# Patient Record
Sex: Female | Born: 1980 | Race: White | Hispanic: No | Smoking: Never smoker
Health system: Southern US, Community
[De-identification: ages and names within clinical notes are randomized; demographics above are authoritative.]

## PROBLEM LIST (undated history)

## (undated) DIAGNOSIS — F329 Major depressive disorder, single episode, unspecified: Secondary | ICD-10-CM

## (undated) DIAGNOSIS — F419 Anxiety disorder, unspecified: Secondary | ICD-10-CM

## (undated) DIAGNOSIS — F32A Depression, unspecified: Secondary | ICD-10-CM

## (undated) DIAGNOSIS — F319 Bipolar disorder, unspecified: Secondary | ICD-10-CM

## (undated) HISTORY — DX: Depression, unspecified: F32.A

## (undated) HISTORY — DX: Major depressive disorder, single episode, unspecified: F32.9

## (undated) HISTORY — DX: Bipolar disorder, unspecified: F31.9

## (undated) HISTORY — DX: Anxiety disorder, unspecified: F41.9

---

## 2002-10-18 HISTORY — PX: TUBAL LIGATION: SHX77

## 2013-07-19 ENCOUNTER — Encounter: Payer: Self-pay | Admitting: Neurology

## 2013-07-23 ENCOUNTER — Ambulatory Visit (INDEPENDENT_AMBULATORY_CARE_PROVIDER_SITE_OTHER): Payer: Medicaid Other | Admitting: Neurology

## 2013-07-23 ENCOUNTER — Encounter: Payer: Self-pay | Admitting: Neurology

## 2013-07-23 VITALS — BP 152/94 | HR 69 | Ht 66.0 in | Wt 277.5 lb

## 2013-07-23 DIAGNOSIS — M5416 Radiculopathy, lumbar region: Secondary | ICD-10-CM

## 2013-07-23 DIAGNOSIS — R202 Paresthesia of skin: Secondary | ICD-10-CM | POA: Insufficient documentation

## 2013-07-23 DIAGNOSIS — IMO0002 Reserved for concepts with insufficient information to code with codable children: Secondary | ICD-10-CM

## 2013-07-23 DIAGNOSIS — R209 Unspecified disturbances of skin sensation: Secondary | ICD-10-CM

## 2013-07-23 NOTE — Progress Notes (Signed)
[facility]  Provider:  Dr Hosie Poisson Referring Provider: Derrek Gu, MD Primary Care Physician:  No primary provider on file.  CC:  Back pain and paresthesias  HPI:  Nancy Snow is a 32 y.o. female here as a referral from Dr. Arlyce Harman for evaluation of back pain and paresthesias  Symptoms have been ongoing since around 2005. At that time she had a fall and feels she hurt her back. She did physical therapy we have and has been on Neurontin for years with no benefit. Pain is in her lower back and buttocks region radiating down to left leg. Described as a burning pain with some paresthesias and sensory loss in the left anterior medial thigh. Symptoms did not go down past the knee. She notes occasional extreme tenderness to palpation and light touch in the anterior and medial left thigh. Denies any weakness in the legs. No sensory loss, numbness or weakness in the right lower 70. She does note some numbness and paresthesias in her left upper stomach from the elbow down into the fifth digit, otherwise no complaints of bilateral upper extremity.  Went to [facility] neurology in the past, started on Neurontin, no benefit. Thinks they may have done a MRI but she is unsure. Unable to get the records.   Denies any visual changes.  Review of Systems: Out of a complete 14 system review, the patient complains of only the following symptoms, and all other reviewed systems are negative. Positive for fatigue anemia easy bruising memory loss headache numbness difficulty swallowing insomnia restless legs depression anxiety none of sleep decreased energy disinterest in activities racing thoughts and sensitivity aching muscles  History   Social History  . Marital Status: Divorced    Spouse Name: N/A    Number of Children: 2  . Years of Education: 8th   Occupational History  . Not on file.   Social History Main Topics  . Smoking status: Never Smoker   . Smokeless tobacco: Never Used  .  Alcohol Use: No  . Drug Use: No  . Sexual Activity: Not on file   Other Topics Concern  . Not on file   Social History Narrative   Patient lives at home with her kids and her mom.    Patient does not work.    Patient has a 8th grade education.    Patient has 2 children.     Family History  Problem Relation Age of Onset  . Bipolar disorder    . Headache    . Depression    . Panic disorder    . Cancer      Past Medical History  Diagnosis Date  . Bipolar 1 disorder   . Depression   . Anxiety     Past Surgical History  Procedure Laterality Date  . Tubal ligation  2004    Current Outpatient Prescriptions  Medication Sig Dispense Refill  . ARIPiprazole (ABILIFY) 15 MG tablet Take 15 mg by mouth daily.      . citalopram (CELEXA) 20 MG tablet Take 20 mg by mouth daily.      . clonazePAM (KLONOPIN) 1 MG tablet Take 1 mg by mouth daily.      Marland Kitchen gabapentin (NEURONTIN) 400 MG capsule Take 400 mg by mouth 2 (two) times daily.      Marland Kitchen zolpidem (AMBIEN) 10 MG tablet Take 10 mg by mouth daily.       No current facility-administered medications for this visit.    Allergies as  of 07/23/2013  . (No Known Allergies)    Vitals: BP 152/94  Pulse 69  Ht 5\' 6"  (1.676 m)  Wt 277 lb 8 oz (125.873 kg)  BMI 44.81 kg/m2 Last Weight:  Wt Readings from Last 1 Encounters:  07/23/13 277 lb 8 oz (125.873 kg)   Last Height:   Ht Readings from Last 1 Encounters:  07/23/13 5\' 6"  (1.676 m)     Physical exam: Exam: Gen: NAD, conversant Eyes: anicteric sclerae, moist conjunctivae HENT: Atraumatic, oropharynx clear Neck: Trachea midline; supple,  Lungs: CTA, no wheezing, rales, rhonic                          CV: RRR, no MRG Abdomen: Soft, non-tender;  Extremities: No peripheral edema  Skin: Normal temperature, no rash,  Psych: Appropriate affect, pleasant  Neuro: MS: AA&Ox3, appropriately interactive, normal affect   Speech: fluent w/o paraphasic error  Memory: good recent  and remote recall  CN: PERRL, EOMI no nystagmus, no ptosis, sensation intact to LT V1-V3 bilat, face symmetric, no weakness, hearing grossly intact, palate elevates symmetrically, shoulder shrug 5/5 bilat,  tongue protrudes midline, no fasiculations noted.  Motor: normal bulk and tone Strength: 5/5  In all extremities  Coord: rapid alternating and point-to-point (FNF, HTS) movements intact.  Reflexes: brisk but symmetrical, bilat downgoing toes  Sens: decreased LT in ulnar distribution from elbow distal to 5th digit on LUE, decreased LT and hyperesthesia in medial and anterior surface L thigh  Gait: posture, stance, stride and arm-swing normal. Tandem gait intact. Able to walk on heels and toes. Romberg absent.   Assessment:  After physical and neurologic examination, review of laboratory studies, imaging, neurophysiology testing and pre-existing records, assessment will be reviewed on the problem list.  Plan:  Treatment plan and additional workup will be reviewed under Problem List.  1)Paresthesias  [name] is a pleasant 32 year old worsening of her initial evaluation of chronic lower back pain and paresthesias of left upper extremity. Her physical exam is pertinent for decreased light touch in an ulnar distribution from elbow down the left upper extremity, paresthesias and left medial and anterior thigh and brisk reflexes throughout. Symptoms are most consistent with a neuropathy versus radiculopathy. Lower showed a symptoms concerning for meralgia paresthetica but not in typical distribution. Based on age and vague symptoms would also consider multiple sclerosis though this is less likely based on otherwise unremarkable exam. Will check EMG nerve conduction study, would consider MRI range and or C-spine in the future. Can consider lyrica or cymbalta in the future.

## 2013-07-23 NOTE — Patient Instructions (Addendum)
Overall you are doing fairly well but I do want to suggest a few things today:   As far as your medications are concerned, I would like to suggest  As far as diagnostic testing: I would like to get a EMG/NCS study. If this is unremarkable would consider MRI C spine  I would like to see you back once the workup is complete, sooner if we need to. Please call us with any interim questions, concerns, problems, updates or refill requests.   Please also call us for any test results so we can go over those with you on the phone.  My clinical assistant and will answer any of your questions and relay your messages to me and also relay most of my messages to you.   Our phone number is [phone]. We also have an after hours call service for urgent matters and there is a physician on-call for urgent questions. For any emergencies you know to call 911 or go to the nearest emergency room

## 2013-07-26 ENCOUNTER — Encounter (INDEPENDENT_AMBULATORY_CARE_PROVIDER_SITE_OTHER): Payer: Self-pay

## 2013-07-26 ENCOUNTER — Ambulatory Visit (INDEPENDENT_AMBULATORY_CARE_PROVIDER_SITE_OTHER): Payer: Medicaid Other | Admitting: Neurology

## 2013-07-26 ENCOUNTER — Telehealth: Payer: Self-pay | Admitting: Neurology

## 2013-07-26 DIAGNOSIS — M79609 Pain in unspecified limb: Secondary | ICD-10-CM

## 2013-07-26 DIAGNOSIS — Z0289 Encounter for other administrative examinations: Secondary | ICD-10-CM

## 2013-07-26 DIAGNOSIS — M5416 Radiculopathy, lumbar region: Secondary | ICD-10-CM

## 2013-07-26 DIAGNOSIS — R209 Unspecified disturbances of skin sensation: Secondary | ICD-10-CM

## 2013-07-26 MED ORDER — DULOXETINE HCL 30 MG PO CPEP: 30.0000 mg | ORAL_CAPSULE | Freq: Every day | ORAL | Status: DC

## 2013-07-26 NOTE — Telephone Encounter (Signed)
Called patient to discuss results of EMG/NCS. Will start patient on Cymbalta 30mg  daily.

## 2013-07-26 NOTE — Procedures (Signed)
  HISTORY:  Nancy Snow is a 32 year old morbidly obese patient with a history of bipolar disorder and a six-month history of numbness involving the left arm and left leg associated with some left lower back pain and some numbness into the left shoulder and upper spine. The patient is being evaluated for these complaints.  NERVE CONDUCTION STUDIES:  Nerve conduction studies were performed on the left upper extremity. The distal motor latencies and motor amplitudes for the median and ulnar nerves were within normal limits. The F wave latencies and nerve conduction velocities for these nerves were also normal. The sensory latencies for the median and ulnar nerves were normal.  Nerve conduction studies were performed on both lower extremities. The distal motor latencies and motor amplitudes for the peroneal and posterior tibial nerves were within normal limits. The nerve conduction velocities for these nerves were also normal. The H reflex latencies were normal. The sensory latencies for the peroneal nerves were within normal limits.   EMG STUDIES:  EMG study was performed on the left upper extremity:  The first dorsal interosseous muscle reveals 2 to 4 K units with full recruitment. No fibrillations or positive waves were noted. The abductor pollicis brevis muscle reveals 2 to 4 K units with full recruitment. No fibrillations or positive waves were noted. The extensor indicis proprius muscle reveals 1 to 3 K units with full recruitment. No fibrillations or positive waves were noted. The pronator teres muscle reveals 2 to 3 K units with full recruitment. No fibrillations or positive waves were noted. The biceps muscle reveals 1 to 2 K units with full recruitment. No fibrillations or positive waves were noted. The triceps muscle reveals 2 to 4 K units with full recruitment. No fibrillations or positive waves were noted. The anterior deltoid muscle reveals 2 to 3 K units with full recruitment. No  fibrillations or positive waves were noted. The cervical paraspinal muscles were tested at 2 levels. No abnormalities of insertional activity were seen at either level tested. There was good relaxation.  EMG study was performed on the left lower extremity:  The tibialis anterior muscle reveals 2 to 4K motor units with full recruitment. No fibrillations or positive waves were seen. The peroneus tertius muscle reveals 2 to 4K motor units with full recruitment. No fibrillations or positive waves were seen. The medial gastrocnemius muscle reveals 1 to 3K motor units with full recruitment. No fibrillations or positive waves were seen. The vastus lateralis muscle reveals 2 to 4K motor units with full recruitment. No fibrillations or positive waves were seen. The iliopsoas muscle reveals 2 to 4K motor units with full recruitment. No fibrillations or positive waves were seen. The biceps femoris muscle (long head) reveals 2 to 4K motor units with full recruitment. No fibrillations or positive waves were seen. The lumbosacral paraspinal muscles were tested at 3 levels, and revealed no abnormalities of insertional activity at all 3 levels tested. There was good relaxation.   IMPRESSION:  Nerve conduction studies done on the left upper extremity and both lower extremities were unremarkable. No evidence of a peripheral neuropathy is seen. EMG evaluation of the left upper and left lower extremities were unremarkable, without evidence of an overlying left cervical or a left lumbosacral radiculopathy.  Marlan Palau MD 07/26/2013 10:57 AM  [facility] [address] [place], [place] [address]  Phone [phone] Fax [phone]

## 2013-10-01 ENCOUNTER — Telehealth: Payer: Self-pay | Admitting: Neurology

## 2013-10-01 MED ORDER — DULOXETINE HCL 30 MG PO CPEP: 30.0000 mg | ORAL_CAPSULE | Freq: Every day | ORAL | Status: DC

## 2013-10-01 NOTE — Telephone Encounter (Signed)
Needs cymbalta called into [facility]

## 2014-01-11 ENCOUNTER — Other Ambulatory Visit: Payer: Self-pay

## 2014-01-11 MED ORDER — DULOXETINE HCL 30 MG PO CPEP: 30.0000 mg | ORAL_CAPSULE | Freq: Every day | ORAL | Status: DC

## 2014-01-21 ENCOUNTER — Other Ambulatory Visit: Payer: Self-pay | Admitting: Neurology

## 2014-01-21 ENCOUNTER — Telehealth: Payer: Self-pay | Admitting: *Deleted

## 2014-01-21 MED ORDER — PREGABALIN 75 MG PO CAPS: 75.0000 mg | ORAL_CAPSULE | Freq: Two times a day (BID) | ORAL | Status: DC

## 2014-01-21 NOTE — Telephone Encounter (Signed)
Pt called to see if anything was called in and what they were going to do that she was in a lot of pain. Pt [facility]. Please call pt concerning the note from Dr. Hosie PoissonSumner. Thanks

## 2014-01-21 NOTE — Telephone Encounter (Signed)
Please let her know I will send a prescription for Lyrica 75mg  twice a day. She should slowly get off the Cymbalta by decreasing to 60mg  daily for 2 weeks, then down to 30mg  daily for 2 weeks and then stop. Thanks.

## 2014-01-21 NOTE — Telephone Encounter (Signed)
Pt calling stating that the Cymbalta does not work and she has tripled the amount and it is still not working, is there anything else pt could try. Please advise

## 2014-01-21 NOTE — Telephone Encounter (Signed)
I called the patient back.  Relayed the message from Dr Minus BreedingSumner's note.  Patient verbalized understanding.

## 2014-01-23 ENCOUNTER — Ambulatory Visit: Payer: Self-pay | Admitting: Neurology

## 2014-01-24 ENCOUNTER — Encounter (INDEPENDENT_AMBULATORY_CARE_PROVIDER_SITE_OTHER): Payer: Self-pay

## 2014-01-24 ENCOUNTER — Encounter: Payer: Self-pay | Admitting: Neurology

## 2014-01-24 ENCOUNTER — Ambulatory Visit (INDEPENDENT_AMBULATORY_CARE_PROVIDER_SITE_OTHER): Payer: Medicaid Other | Admitting: Neurology

## 2014-01-24 VITALS — BP 158/99 | HR 67 | Ht 64.0 in | Wt 286.0 lb

## 2014-01-24 DIAGNOSIS — M545 Low back pain, unspecified: Secondary | ICD-10-CM

## 2014-01-24 MED ORDER — OXCARBAZEPINE 300 MG PO TABS: 300.0000 mg | ORAL_TABLET | Freq: Two times a day (BID) | ORAL | Status: DC

## 2014-01-24 NOTE — Progress Notes (Signed)
[facility]  Provider:  Dr Hosie Poisson Referring Provider: No ref. provider found Primary Care Physician:  No primary provider on file.  CC:  Back pain and paresthesias  HPI:  Nancy Snow is a 33 y.o. female here as a follow up for evaluation of back pain and paresthesias. Last visit was 07/2013 at which time she was started on Cymbalta and had a normal EMG/NCS. Since then stopped Cymbalta and switched to Lyrica. States since she started the Lyrica she continues to have pain and is having hallucinations on the Lyrica. States the pain is in the lower back and radiates down the posterior portion of her left leg. Notes episodes of muscle spasms in both left and right lumbar region. Notes all the symptoms have started since the fall she had in 2005. Notes some difficulty walking due to the pain in her lower back.    Initial visit 07/2013: Symptoms have been ongoing since around 2005. At that time she had a fall and feels she hurt her back. She did physical therapy we have and has been on Neurontin for years with no benefit. Pain is in her lower back and buttocks region radiating down to left leg. Described as a burning pain with some paresthesias and sensory loss in the left anterior medial thigh. Symptoms did not go down past the knee. She notes occasional extreme tenderness to palpation and light touch in the anterior and medial left thigh. Denies any weakness in the legs. No sensory loss, numbness or weakness in the right lower 70. She does note some numbness and paresthesias in her left upper stomach from the elbow down into the fifth digit, otherwise no complaints of bilateral upper extremity.  Went to [facility] neurology in the past, started on Neurontin, no benefit. Thinks they may have done a MRI but she is unsure. Unable to get the records.   Denies any visual changes.  Review of Systems: Out of a complete 14 system review, the patient complains of only the following symptoms,  and all other reviewed systems are negative. Positive for fatigue anemia easy bruising memory loss headache numbness difficulty swallowing insomnia restless legs depression anxiety none of sleep decreased energy disinterest in activities racing thoughts and sensitivity aching muscles  History   Social History  . Marital Status: Divorced    Spouse Name: N/A    Number of Children: 2  . Years of Education: 8th   Occupational History  . Not on file.   Social History Main Topics  . Smoking status: Never Smoker   . Smokeless tobacco: Never Used  . Alcohol Use: No  . Drug Use: No  . Sexual Activity: Not on file   Other Topics Concern  . Not on file   Social History Narrative   Patient lives at home with her kids and her mom.    Patient does not work.    Patient has a 8th grade education.    Patient has 2 children.     Family History  Problem Relation Age of Onset  . Bipolar disorder    . Headache    . Depression    . Panic disorder    . Cancer      Past Medical History  Diagnosis Date  . Bipolar 1 disorder   . Depression   . Anxiety     Past Surgical History  Procedure Laterality Date  . Tubal ligation  2004    Current Outpatient Prescriptions  Medication Sig Dispense Refill  .  ARIPiprazole (ABILIFY) 15 MG tablet Take 15 mg by mouth daily.      . citalopram (CELEXA) 20 MG tablet Take 20 mg by mouth daily.      . clonazePAM (KLONOPIN) 1 MG tablet Take 1 mg by mouth daily.      Marland Kitchen. zolpidem (AMBIEN) 10 MG tablet Take 10 mg by mouth daily.      . Oxcarbazepine (TRILEPTAL) 300 MG tablet Take 1 tablet (300 mg total) by mouth 2 (two) times daily.  60 tablet  3   No current facility-administered medications for this visit.    Allergies as of 01/24/2014  . (No Known Allergies)    Vitals: BP 158/99  Pulse 67  Ht 5\' 4"  (1.626 m)  Wt 286 lb (129.729 kg)  BMI 49.07 kg/m2 Last Weight:  Wt Readings from Last 1 Encounters:  01/24/14 286 lb (129.729 kg)   Last  Height:   Ht Readings from Last 1 Encounters:  01/24/14 5\' 4"  (1.626 m)     Physical exam: Exam: Gen: NAD, conversant Eyes: anicteric sclerae, moist conjunctivae HENT: Atraumatic, oropharynx clear Neck: Trachea midline; supple,  Lungs: CTA, no wheezing, rales, rhonic                          CV: RRR, no MRG Abdomen: Soft, non-tender;  Extremities: No peripheral edema  Skin: Normal temperature, no rash,  Psych: Appropriate affect, pleasant  Neuro: Nancy: AA&Ox3, appropriately interactive, normal affect   Speech: fluent w/o paraphasic error  Memory: good recent and remote recall  CN: PERRL, EOMI no nystagmus, no ptosis, sensation intact to LT V1-V3 bilat, face symmetric, no weakness, hearing grossly intact, palate elevates symmetrically, shoulder shrug 5/5 bilat,  tongue protrudes midline, no fasiculations noted.  Motor: normal bulk and tone Strength: 5/5  In all extremities  Coord: rapid alternating and point-to-point (FNF, HTS) movements intact.  Reflexes: brisk but symmetrical, bilat downgoing toes  Sens: decreased LT in ulnar distribution from elbow distal to 5th digit on LUE, decreased LT and hyperesthesia in medial and anterior surface L thigh  Gait: posture, stance, stride and arm-swing normal. Tandem gait intact. Able to walk on heels and toes. Romberg absent.   Assessment:  After physical and neurologic examination, review of laboratory studies, imaging, neurophysiology testing and pre-existing records, assessment will be reviewed on the problem list.  Plan:  Treatment plan and additional workup will be reviewed under Problem List.  1)Paresthesias 2)Lumbar back pain  Nancy Snow is a pleasant [age] year old worsening of her follow up evaluation of chronic lower back pain and paresthesias of left upper extremity. Her physical exam is pertinent for decreased light touch in an ulnar distribution from elbow down the left upper extremity, paresthesias and left medial and  anterior thigh and brisk reflexes throughout. Symptoms are most consistent with a neuropathy versus radiculopathy. Since last visit has had normal EMG/NCS and failed treatment with Cymbalta and Lyrica. Will check MRI L spine and try patient on trileptal 300mg  BID for symptomatic relief.

## 2014-01-24 NOTE — Patient Instructions (Addendum)
Overall you are doing fairly well but I do want to suggest a few things today:   Remember to drink plenty of fluid, eat healthy meals and do not skip any meals. Try to eat protein with a every meal and eat a healthy snack such as fruit or nuts in between meals. Try to keep a regular sleep-wake schedule and try to exercise daily, particularly in the form of walking, 20-30 minutes a day, if you can.   As far as your medications are concerned, I would like to suggest trying Trileptal 300mg  twice a day.   As far as diagnostic testing:  1)I would like you to have a MRI of your lumbar spine  Follow up as needed. Please call us with any interim questions, concerns, problems, updates or refill requests.   My clinical assistant and will answer any of your questions and relay your messages to me and also relay most of my messages to you.   Our phone number is [phone]. We also have an after hours call service for urgent matters and there is a physician on-call for urgent questions. For any emergencies you know to call 911 or go to the nearest emergency room

## 2014-02-04 ENCOUNTER — Telehealth: Payer: Self-pay | Admitting: Neurology

## 2014-02-04 MED ORDER — TIZANIDINE HCL 2 MG PO TABS: 2.0000 mg | ORAL_TABLET | Freq: Three times a day (TID) | ORAL | Status: DC

## 2014-02-04 NOTE — Telephone Encounter (Signed)
Pt is calling stating that she is taking Trileptal and the medication is not helping with the pain and would like to know if there is something else that she could take. Dr. Hosie PoissonSumner is out of the office, [facility] Dr. Anne HahnWillis. Please advise

## 2014-02-04 NOTE — Telephone Encounter (Signed)
I called patient. The patient has ongoing burning pains in the back with numbness in the back as well, pain into the buttock area. The patient is on gabapentin, she could not tolerate Lyrica, and the Trileptal is operating no real benefit. The patient will be placed on tizanidine for muscle spasm. The patient indicates that in the past she has undergone physical therapy. The patient may benefit from physical therapy again in the future.

## 2014-02-05 ENCOUNTER — Telehealth: Payer: Self-pay | Admitting: *Deleted

## 2014-02-05 NOTE — Telephone Encounter (Signed)
Patient called inquiring if taking Tizanidine is safe to take along with citalopram (CELEXA) 20 MG tablet.  She read the medication brochure for Celexa and it stated Tizanidine wasn't recommended to take at the same time.  Please call patient at earliest convenience.  thanks

## 2014-02-08 ENCOUNTER — Other Ambulatory Visit: Payer: Self-pay | Admitting: Neurology

## 2014-02-08 MED ORDER — BACLOFEN 10 MG PO TABS: 5.0000 mg | ORAL_TABLET | Freq: Two times a day (BID) | ORAL | Status: DC

## 2014-02-08 NOTE — Telephone Encounter (Signed)
Patient calling to state that she needs a different medication because the new one that Dr. Hosie PoissonSumner placed her on caused a reaction with her other medication. Patient describes dizziness, insomnia, and shortness of breath. Please call and advise patient.

## 2014-02-08 NOTE — Telephone Encounter (Signed)
Returned patients call. Will stop tizanidine and start baclofen 5mg  bid for muscle spasms. Can consider PT referral if no improvement as this has given good benefit in the past

## 2014-02-20 ENCOUNTER — Other Ambulatory Visit: Payer: Self-pay | Admitting: Neurology

## 2014-02-20 NOTE — Telephone Encounter (Signed)
Per 04/24 notes

## 2014-02-26 ENCOUNTER — Telehealth: Payer: Self-pay | Admitting: Neurology

## 2014-02-26 ENCOUNTER — Other Ambulatory Visit: Payer: Self-pay | Admitting: Neurology

## 2014-02-26 DIAGNOSIS — R531 Weakness: Secondary | ICD-10-CM

## 2014-02-26 DIAGNOSIS — M545 Low back pain, unspecified: Secondary | ICD-10-CM

## 2014-02-26 DIAGNOSIS — R202 Paresthesia of skin: Secondary | ICD-10-CM

## 2014-02-26 MED ORDER — METHYLPREDNISOLONE (PAK) 4 MG PO TABS: ORAL_TABLET | ORAL | Status: DC

## 2014-02-26 NOTE — Telephone Encounter (Signed)
Called patient to get her primary care provider's name, was not in the system, I am working on the referral that was placed by Dr Hosie PoissonSumner.

## 2014-02-26 NOTE — Telephone Encounter (Signed)
Patient calling to inform Dr Hosie PoissonSumner, her back pain has gotten worse and experiencing numbness in left leg.  Oxcarbazepine (TRILEPTAL) 300 MG tablet is not helping her discomfort.  Please call and advise.  Thanks

## 2014-02-26 NOTE — Telephone Encounter (Signed)
Returned patients call. With history of majority of symptoms being on the left side will check MRI brain to rule out MS. Will prescribe medrol dose pack for symptomatic relief.

## 2014-02-26 NOTE — Telephone Encounter (Signed)
Please let her know I would like her to follow up with physical therapy to help treat her symptoms. She will be called to schedule an appointment with them.

## 2014-02-26 NOTE — Telephone Encounter (Signed)
Shared message with patient per Dr Hosie PoissonSumner, and patient verbalized understanding and said that she was treated by PT before, did not help but is willing to try again. Would like Dr Hosie PoissonSumner to give her a call back , does not know what to do in the meantime, is not having the muscle spasms any longer but unable to get out of bed, has been there since Saturday,can't bend or barely use the lt leg.

## 2014-03-14 ENCOUNTER — Ambulatory Visit
Admission: RE | Admit: 2014-03-14 | Discharge: 2014-03-14 | Disposition: A | Discharge status: Home / Self Care | Payer: Medicaid Other | Source: Ambulatory Visit | Attending: Neurology | Admitting: Neurology

## 2014-03-14 DIAGNOSIS — R202 Paresthesia of skin: Secondary | ICD-10-CM

## 2014-03-14 DIAGNOSIS — R209 Unspecified disturbances of skin sensation: Secondary | ICD-10-CM

## 2014-03-14 DIAGNOSIS — R531 Weakness: Secondary | ICD-10-CM

## 2014-03-14 MED ORDER — GADOBENATE DIMEGLUMINE 529 MG/ML IV SOLN
20.0000 mL | Freq: Once | INTRAVENOUS | Status: AC | PRN
  Administered 2014-03-14: 20 mL via INTRAVENOUS

## 2014-03-18 NOTE — Progress Notes (Signed)
Quick Note:  Called patient and scheduled her for 04/11/14 at 11:30 am. ______

## 2014-03-20 ENCOUNTER — Telehealth: Payer: Self-pay | Admitting: *Deleted

## 2014-03-20 NOTE — Telephone Encounter (Signed)
Called patient to see if she wanted to come in earlier than her appointment on 04/11/14, appointment was r/s to 03/21/14 at 2:30

## 2014-03-21 ENCOUNTER — Encounter: Payer: Self-pay | Admitting: Neurology

## 2014-03-21 ENCOUNTER — Ambulatory Visit (INDEPENDENT_AMBULATORY_CARE_PROVIDER_SITE_OTHER): Payer: Medicaid Other | Admitting: Neurology

## 2014-03-21 VITALS — BP 143/96 | HR 74 | Ht 64.0 in | Wt 286.0 lb

## 2014-03-21 DIAGNOSIS — I6350 Cerebral infarction due to unspecified occlusion or stenosis of unspecified cerebral artery: Secondary | ICD-10-CM

## 2014-03-21 DIAGNOSIS — M545 Low back pain, unspecified: Secondary | ICD-10-CM

## 2014-03-21 DIAGNOSIS — I639 Cerebral infarction, unspecified: Secondary | ICD-10-CM

## 2014-03-21 NOTE — Progress Notes (Signed)
[facility]  Provider:  Dr Hosie Poisson Referring Provider: No ref. provider found Primary Care Physician:  No primary provider on file.  CC:  Back pain and paresthesias  HPI:  Nancy Snow is a 33 y.o. female here as a follow up for evaluation of back pain and paresthesias. Last visit was 01/2014 at which time she was started on Trileptal 300mg  BID after failing Lyrica and Cymbalta. Has worsening muscle spasms, baclofen was added. 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  Continues to have lower back pain with no benefit from recently added baclofen. No acute concerns at this time.    Initial visit 07/2013: Symptoms have been ongoing since around 2005. At that time she had a fall and feels she hurt her back. She did physical therapy we have and has been on Neurontin for years with no benefit. Pain is in her lower back and buttocks region radiating down to left leg. Described as a burning pain with some paresthesias and sensory loss in the left anterior medial thigh. Symptoms did not go down past the knee. She notes occasional extreme tenderness to palpation and light touch in the anterior and medial left thigh. Denies any weakness in the legs. No sensory loss, numbness or weakness in the right lower 70. She does note some numbness and paresthesias in her left upper stomach from the elbow down into the fifth digit, otherwise no complaints of bilateral upper extremity.  Went to [facility] neurology in the past, started on Neurontin, no benefit. Thinks they may have done a MRI but she is unsure. Unable to get the records.   Denies any visual changes.  Review of Systems: Out of a complete 14 system review, the patient complains of only the following symptoms, and all other reviewed systems are negative. Positive for fatigue anemia numbness difficulty swallowing insomnia restless legs depression disinterest in activities racing thoughts and sensitivity  aching muscles  History   Social History  . Marital Status: Divorced    Spouse Name: N/A    Number of Children: 2  . Years of Education: 8th   Occupational History  . Not on file.   Social History Main Topics  . Smoking status: Never Smoker   . Smokeless tobacco: Never Used  . Alcohol Use: No  . Drug Use: No  . Sexual Activity: Not on file   Other Topics Concern  . Not on file   Social History Narrative   Patient lives at home with her kids and her mom.    Patient does not work.    Patient has a 8th grade education.    Patient has 2 children.     Family History  Problem Relation Age of Onset  . Bipolar disorder    . Headache    . Depression    . Panic disorder    . Cancer      Past Medical History  Diagnosis Date  . Bipolar 1 disorder   . Depression   . Anxiety     Past Surgical History  Procedure Laterality Date  . Tubal ligation  2004    Current Outpatient Prescriptions  Medication Sig Dispense Refill  . ARIPiprazole (ABILIFY) 15 MG tablet Take 15 mg by mouth daily.      . baclofen (LIORESAL) 10 MG tablet TAKE 1/2 TABLET BY MOUTH TWICE A DAY  30 tablet  0  . citalopram (CELEXA) 20 MG tablet Take 20 mg by mouth daily.      Marland Kitchen  clonazePAM (KLONOPIN) 1 MG tablet Take 1 mg by mouth daily.      . methylPREDNIsolone (MEDROL DOSPACK) 4 MG tablet follow package directions  21 tablet  0  . Oxcarbazepine (TRILEPTAL) 300 MG tablet Take 1 tablet (300 mg total) by mouth 2 (two) times daily.  60 tablet  3  . zolpidem (AMBIEN) 10 MG tablet Take 10 mg by mouth daily.       No current facility-administered medications for this visit.    Allergies as of 03/21/2014  . (No Known Allergies)    Vitals: BP 143/96  Pulse 74  Ht 5\' 4"  (1.626 m)  Wt 286 lb (129.729 kg)  BMI 49.07 kg/m2 Last Weight:  Wt Readings from Last 1 Encounters:  03/21/14 286 lb (129.729 kg)   Last Height:   Ht Readings from Last 1 Encounters:  03/21/14 5\' 4"  (1.626 m)     Physical  exam: Exam: Gen: NAD, conversant Eyes: anicteric sclerae, moist conjunctivae HENT: Atraumatic, oropharynx clear Neck: Trachea midline; supple,  Lungs: CTA, no wheezing, rales, rhonic                          CV: RRR, no MRG Abdomen: Soft, non-tender;  Extremities: No peripheral edema  Skin: Normal temperature, no rash,  Psych: Appropriate affect, pleasant  Neuro: Nancy: AA&Ox3, appropriately interactive, normal affect   Speech: fluent w/o paraphasic error  Memory: good recent and remote recall  CN: PERRL, EOMI no nystagmus, no ptosis, sensation intact to LT V1-V3 bilat, face symmetric, no weakness, hearing grossly intact, palate elevates symmetrically, shoulder shrug 5/5 bilat,  tongue protrudes midline, no fasiculations noted.  Motor: normal bulk and tone Strength: 5/5  In all extremities  Coord: rapid alternating and point-to-point (FNF, HTS) movements intact.  Reflexes: brisk but symmetrical, bilat downgoing toes  Sens: decreased LT in ulnar distribution from elbow distal to 5th digit on LUE, decreased LT and hyperesthesia in medial and anterior surface L thigh  Gait: posture, stance, stride and arm-swing normal. Tandem gait intact. Able to walk on heels and toes. Romberg absent.   Assessment:  After physical and neurologic examination, review of laboratory studies, imaging, neurophysiology testing and pre-existing records, assessment will be reviewed on the problem list.  Plan:  Treatment plan and additional workup will be reviewed under Problem List.  1)Paresthesias 2)Lumbar back pain 3)Remote CVA  Nancy Snow is a pleasant 33 year old worsening of her follow up evaluation of chronic lower back pain and paresthesias of left upper extremity. Her main concern is continued chronic lumbar back pain which has been refractory to pain medication. Will try to get MRI of lumbar spine if insurance will approve. Will refer to pain clinic. MRI shows questionable small right thalamic  infarct which was asymptomatic. Will check Hemoglobin A1c and Lipid panel. Will have patient start ASA 81mg  daily. Follow up as needed.

## 2014-03-21 NOTE — Addendum Note (Signed)
Addended by: Ramond Marrow on: 03/21/2014 04:13 PM   Modules accepted: Orders

## 2014-03-21 NOTE — Patient Instructions (Signed)
Overall you are doing fairly well but I do want to suggest a few things today:   Remember to drink plenty of fluid, eat healthy meals and do not skip any meals. Try to eat protein with a every meal and eat a healthy snack such as fruit or nuts in between meals. Try to keep a regular sleep-wake schedule and try to exercise daily, particularly in the form of walking, 20-30 minutes a day, if you can.   As far as diagnostic testing:  1)Please have some blood work completed today after checking out 2)I would like you to have a MRI of the lumbar spine, you will be called to schedule this  I would like you to follow up with a pain doctor, you will be called to schedule this  Please also call us for any test results so we can go over those with you on the phone.  My clinical assistant and will answer any of your questions and relay your messages to me and also relay most of my messages to you.   Our phone number is [phone]. We also have an after hours call service for urgent matters and there is a physician on-call for urgent questions. For any emergencies you know to call 911 or go to the nearest emergency room

## 2014-03-22 ENCOUNTER — Other Ambulatory Visit: Payer: Self-pay | Admitting: Neurology

## 2014-03-22 LAB — LIPID PANEL WITH LDL/HDL RATIO
Cholesterol, Total: 211 mg/dL — ABNORMAL HIGH (ref 100–199)
HDL: 40 mg/dL (ref >39)
LDL Calculated: 148 mg/dL — ABNORMAL HIGH (ref 0–99)
LDL/HDL RATIO: 3.7 ratio — AB (ref 0.0–3.2)
TRIGLYCERIDES: 116 mg/dL (ref 0–149)
VLDL Cholesterol Cal: 23 mg/dL (ref 5–40)

## 2014-03-22 LAB — HGB A1C W/O EAG: Hgb A1c MFr Bld: 5.7 % — ABNORMAL HIGH (ref 4.8–5.6)

## 2014-03-22 NOTE — Progress Notes (Signed)
Quick Note:  Called patient and informed her of elevated blood sugars and cholesterol, and that she should follow up with her PCP to discuss better management, also informed patient that she may try to adjust her diet and if that doesn't work she then may need medication, patient expressed understanding and had no further questions or concerns. ______

## 2014-03-23 LAB — LUPUS ANTICOAGULANT
DRVVT: 25.1 s (ref 0.0–55.1)
PTT LA: 32.6 s (ref 0.0–50.0)
THROMBIN TIME: 16 s (ref 0.0–20.0)
dPT Confirm Ratio: 0.92 Ratio (ref 0.00–1.20)
dPT: 40.4 s (ref 0.0–55.0)

## 2014-04-02 ENCOUNTER — Telehealth: Payer: Self-pay | Admitting: Neurology

## 2014-04-02 NOTE — Telephone Encounter (Signed)
Patient calling to state that she has an MRI tomorrow night but is claustrophobic and has not received a call regarding a sedative that she can take before. Please return call to patient and advise.

## 2014-04-03 ENCOUNTER — Ambulatory Visit
Admission: RE | Admit: 2014-04-03 | Discharge: 2014-04-03 | Disposition: A | Discharge status: Home / Self Care | Payer: Medicaid Other | Source: Ambulatory Visit | Attending: Neurology | Admitting: Neurology

## 2014-04-03 DIAGNOSIS — M545 Low back pain, unspecified: Secondary | ICD-10-CM

## 2014-04-03 NOTE — Telephone Encounter (Signed)
Spoke to patient and she will pick up Xanax packet at the front desk.

## 2014-04-03 NOTE — Telephone Encounter (Signed)
That is fine. Thanks 

## 2014-04-03 NOTE — Telephone Encounter (Signed)
Patient requesting something to relax before MRI, we have xanax packets in pharmacy that we can give.  Just need your verbal order.

## 2014-04-04 ENCOUNTER — Other Ambulatory Visit: Payer: Self-pay | Admitting: Neurology

## 2014-04-05 NOTE — Telephone Encounter (Signed)
Nancy BlackwaterPeter Justin Sumner, DO at 02/08/2014 12:53 PM     Status: Signed        Returned patients call. Will stop tizanidine and start baclofen 5mg  bid for muscle spasms.

## 2014-04-08 ENCOUNTER — Other Ambulatory Visit: Payer: Self-pay | Admitting: Neurology

## 2014-04-08 NOTE — Telephone Encounter (Signed)
Per note on 04/21, Med was discontinued

## 2014-04-09 NOTE — Progress Notes (Signed)
Quick Note:  Spoke with patient and informed her of MRI lumbar spine showing some arthritic changes but overall normal, and to follow up with the pain doctor for additional treatment options. Patient expressed understanding no further questions or concerns. ______

## 2014-04-11 ENCOUNTER — Ambulatory Visit: Payer: Self-pay | Admitting: Neurology

## 2014-05-09 ENCOUNTER — Other Ambulatory Visit: Payer: Self-pay | Admitting: Neurology

## 2014-07-12 ENCOUNTER — Other Ambulatory Visit: Payer: Self-pay | Admitting: Neurology

## 2014-09-11 ENCOUNTER — Other Ambulatory Visit: Payer: Self-pay | Admitting: Neurology

## 2014-09-11 NOTE — Telephone Encounter (Signed)
WID 

## 2014-10-14 ENCOUNTER — Other Ambulatory Visit: Payer: Self-pay | Admitting: Neurology

## 2014-10-15 NOTE — Telephone Encounter (Signed)
I spoke with patient who will said she will call us back.

## 2014-11-14 ENCOUNTER — Other Ambulatory Visit: Payer: Self-pay | Admitting: Neurology

## 2016-06-24 ENCOUNTER — Encounter: Payer: Self-pay | Admitting: Neurology

## 2016-06-24 ENCOUNTER — Ambulatory Visit (INDEPENDENT_AMBULATORY_CARE_PROVIDER_SITE_OTHER): Payer: Medicaid Other | Admitting: Neurology

## 2016-06-24 VITALS — BP 160/104 | HR 66 | Ht 64.0 in | Wt 264.6 lb

## 2016-06-24 DIAGNOSIS — G43909 Migraine, unspecified, not intractable, without status migrainosus: Secondary | ICD-10-CM | POA: Insufficient documentation

## 2016-06-24 DIAGNOSIS — M545 Low back pain, unspecified: Secondary | ICD-10-CM

## 2016-06-24 DIAGNOSIS — G43001 Migraine without aura, not intractable, with status migrainosus: Secondary | ICD-10-CM

## 2016-06-24 DIAGNOSIS — I639 Cerebral infarction, unspecified: Secondary | ICD-10-CM

## 2016-06-24 DIAGNOSIS — M797 Fibromyalgia: Secondary | ICD-10-CM | POA: Diagnosis not present

## 2016-06-24 DIAGNOSIS — H4710 Unspecified papilledema: Secondary | ICD-10-CM

## 2016-06-24 DIAGNOSIS — R51 Headache with orthostatic component, not elsewhere classified: Secondary | ICD-10-CM

## 2016-06-24 DIAGNOSIS — H9190 Unspecified hearing loss, unspecified ear: Secondary | ICD-10-CM | POA: Diagnosis not present

## 2016-06-24 DIAGNOSIS — H539 Unspecified visual disturbance: Secondary | ICD-10-CM

## 2016-06-24 DIAGNOSIS — R7309 Other abnormal glucose: Secondary | ICD-10-CM | POA: Diagnosis not present

## 2016-06-24 DIAGNOSIS — E785 Hyperlipidemia, unspecified: Secondary | ICD-10-CM | POA: Diagnosis not present

## 2016-06-24 DIAGNOSIS — I6381 Other cerebral infarction due to occlusion or stenosis of small artery: Secondary | ICD-10-CM

## 2016-06-24 DIAGNOSIS — M542 Cervicalgia: Secondary | ICD-10-CM | POA: Diagnosis not present

## 2016-06-24 DIAGNOSIS — G8929 Other chronic pain: Secondary | ICD-10-CM

## 2016-06-24 DIAGNOSIS — G43009 Migraine without aura, not intractable, without status migrainosus: Secondary | ICD-10-CM

## 2016-06-24 DIAGNOSIS — H05119 Granuloma of unspecified orbit: Secondary | ICD-10-CM

## 2016-06-24 MED ORDER — AMITRIPTYLINE HCL 25 MG PO TABS: 25.0000 mg | ORAL_TABLET | Freq: Every day | ORAL | 12 refills | Status: AC

## 2016-06-24 NOTE — Patient Instructions (Addendum)
Remember to drink plenty of fluid, eat healthy meals and do not skip any meals. Try to eat protein with a every meal and eat a healthy snack such as fruit or nuts in between meals. Try to keep a regular sleep-wake schedule and try to exercise daily, particularly in the form of walking, 20-30 minutes a day, if you can.   As far as your medications are concerned, I would like to suggest; Amitriptyline, referral to pain clinic for evaluation of pain management and shots. Daily baby aspirin.  As far as diagnostic testing: MRI of the brain, MRI orbits  I would like to see you back in 4 months, sooner if we need to. Please call us with any interim questions, concerns, problems, updates or refill requests.   Our phone number is [phone]. We also have an after hours call service for urgent matters and there is a physician on-call for urgent questions. For any emergencies you know to call 911 or go to the nearest emergency room  Amitriptyline tablets What is this medicine? AMITRIPTYLINE (a mee TRIP ti leen) is used to treat depression. This medicine may be used for other purposes; ask your health care provider or pharmacist if you have questions. What should I tell my health care provider before I take this medicine? They need to know if you have any of these conditions: -an alcohol problem -asthma, difficulty breathing -bipolar disorder or schizophrenia -difficulty passing urine, prostate trouble -glaucoma -heart disease or previous heart attack -liver disease -over active thyroid -seizures -thoughts or plans of suicide, a previous suicide attempt, or family history of suicide attempt -an unusual or allergic reaction to amitriptyline, other medicines, foods, dyes, or preservatives -pregnant or trying to get pregnant -breast-feeding How should I use this medicine? Take this medicine by mouth with a drink of water. Follow the directions on the prescription label. You can take the tablets with  or without food. Take your medicine at regular intervals. Do not take it more often than directed. Do not stop taking this medicine suddenly except upon the advice of your doctor. Stopping this medicine too quickly may cause serious side effects or your condition may worsen. A special MedGuide will be given to you by the pharmacist with each prescription and refill. Be sure to read this information carefully each time. Talk to your pediatrician regarding the use of this medicine in children. Special care may be needed. Overdosage: If you think you have taken too much of this medicine contact a poison control center or emergency room at once. NOTE: This medicine is only for you. Do not share this medicine with others. What if I miss a dose? If you miss a dose, take it as soon as you can. If it is almost time for your next dose, take only that dose. Do not take double or extra doses. What may interact with this medicine? Do not take this medicine with any of the following medications: -arsenic trioxide -certain medicines used to regulate abnormal heartbeat or to treat other heart conditions -cisapride -droperidol -halofantrine -linezolid -MAOIs like Carbex, Eldepryl, Marplan, Nardil, and Parnate -methylene blue -other medicines for mental depression -phenothiazines like perphenazine, thioridazine and chlorpromazine -pimozide -probucol -procarbazine -sparfloxacin -St. John's Wort -ziprasidone This medicine may also interact with the following medications: -atropine and related drugs like hyoscyamine, scopolamine, tolterodine and others -barbiturate medicines for inducing sleep or treating seizures, like phenobarbital -cimetidine -disulfiram -ethchlorvynol -thyroid hormones such as levothyroxine This list may not describe all possible interactions. Give  your health care provider a list of all the medicines, herbs, non-prescription drugs, or dietary supplements you use. Also tell them if  you smoke, drink alcohol, or use illegal drugs. Some items may interact with your medicine. What should I watch for while using this medicine? Tell your doctor if your symptoms do not get better or if they get worse. Visit your doctor or health care professional for regular checks on your progress. Because it may take several weeks to see the full effects of this medicine, it is important to continue your treatment as prescribed by your doctor. Patients and their families should watch out for new or worsening thoughts of suicide or depression. Also watch out for sudden changes in feelings such as feeling anxious, agitated, panicky, irritable, hostile, aggressive, impulsive, severely restless, overly excited and hyperactive, or not being able to sleep. If this happens, especially at the beginning of treatment or after a change in dose, call your health care professional. Nancy Snow may get drowsy or dizzy. Do not drive, use machinery, or do anything that needs mental alertness until you know how this medicine affects you. Do not stand or sit up quickly, especially if you are an older patient. This reduces the risk of dizzy or fainting spells. Alcohol may interfere with the effect of this medicine. Avoid alcoholic drinks. Do not treat yourself for coughs, colds, or allergies without asking your doctor or health care professional for advice. Some ingredients can increase possible side effects. Your mouth may get dry. Chewing sugarless gum or sucking hard candy, and drinking plenty of water will help. Contact your doctor if the problem does not go away or is severe. This medicine may cause dry eyes and blurred vision. If you wear contact lenses you may feel some discomfort. Lubricating drops may help. See your eye doctor if the problem does not go away or is severe. This medicine can cause constipation. Try to have a bowel movement at least every 2 to 3 days. If you do not have a bowel movement for 3 days, call your  doctor or health care professional. This medicine can make you more sensitive to the sun. Keep out of the sun. If you cannot avoid being in the sun, wear protective clothing and use sunscreen. Do not use sun lamps or tanning beds/booths. What side effects may I notice from receiving this medicine? Side effects that you should report to your doctor or health care professional as soon as possible: -allergic reactions like skin rash, itching or hives, swelling of the face, lips, or tongue -abnormal production of milk in females -breast enlargement in both males and females -breathing problems -confusion, hallucinations -fast, irregular heartbeat -fever with increased sweating -muscle stiffness, or spasms -pain or difficulty passing urine, loss of bladder control -seizures -suicidal thoughts or other mood changes -swelling of the testicles -tingling, pain, or numbness in the feet or hands -yellowing of the eyes or skin Side effects that usually do not require medical attention (report to your doctor or health care professional if they continue or are bothersome): -change in sex drive or performance -constipation or diarrhea -nausea, vomiting -weight gain or loss This list may not describe all possible side effects. Call your doctor for medical advice about side effects. You may report side effects to FDA at 1-800-FDA-1088. Where should I keep my medicine? Keep out of the reach of children. Store at room temperature between 20 and 25 degrees C (68 and 77 degrees F). Throw away any unused medicine  after the expiration date. NOTE: This sheet is a summary. It may not cover all possible information. If you have questions about this medicine, talk to your doctor, pharmacist, or health care provider.    2016, [employer] Standard. (2012-02-21 13:50:32)

## 2016-06-24 NOTE — Progress Notes (Signed)
[facility]    Provider:  Dr Lucia Gaskins  CC:  Increased back pain  HPI:  Nancy Snow is a 35 y.o. female here as a follow up. She has Fibromyalgia and she hurts all over. Past medical history of depression, bipolar 1 disorder, anxiety and fibromyalgia, Chronic low back and neck pain. She has not slept in 2 months she has to rotate due to back pain and pain shooting from the back down the left leg. She has tightness in the low back, shooting pain down the left leg, and she has neck pain and muscle pain. Physical therapy didn't work, she went for a year. Tried Lyrica and had side effects. Neurontin doesn't work she has been on it for years. She has been up to high doses. She has tried everything in the book. Pain in the leg is new over the last 2 months only the left side with radiation into her buttocks and down the back of the left side of the leg. She can't sleep. Medications tried include: Celexa, Neurontin, Lyrica, Trileptal, Muscle relaxers, cymbalta.Her memory is worsening. Her speech is worsening and she is worried she had another TIA she is not on aspirin. She has worsening headaches. She has a lot of neck stiffness. She is having vision changes, left-sided headache, pressure, vision changes, getting blurry vision, pressure in the head, worse with laying down, she has hearing changes and feels shooting pains in the ear. FHx of seizures, strokes, and migraines. Headaches with nausea, vomiting, severe 10/10 and lasts for 2 days worsening. Chronic continuous low back pain. 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   MRI Lumbar spine:  Mildly abnormal MRI lumbar spine (without) demonstrating: 1. Multi-level facet hypertrophy.  2. Sacro-iliac arthropathy.  3. No significant disc bulging. No spinal stenosis or foraminal narrowing.  MRI brain:  Abnormal MRI scan of the brain showing a tiny right thalamic white matter hyperintensity likely remote age infarct versus gliosis or  injury. No acute abnormalities are noted.   HPI 03/21/2014 Elspeth Cho: LEI DOWER is a 35 y.o. female here as a follow up for evaluation of back pain and paresthesias. Last visit was 01/2014 at which time she was started on Trileptal 300mg  BID after failing Lyrica and Cymbalta. Has worsening muscle spasms, baclofen was added. 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  Continues to have lower back pain with no benefit from recently added baclofen. No acute concerns at this time.   Initial visit 07/2013 Dr. Hosie Poisson: Symptoms have been ongoing since around 2005. At that time she had a fall and feels she hurt her back. She did physical therapy we have and has been on Neurontin for years with no benefit. Pain is in her lower back and buttocks region radiating down to left leg. Described as a burning pain with some paresthesias and sensory loss in the left anterior medial thigh. Symptoms did not go down past the knee. She notes occasional extreme tenderness to palpation and light touch in the anterior and medial left thigh. Denies any weakness in the legs. No sensory loss, numbness or weakness in the right lower 70. She does note some numbness and paresthesias in her left upper stomach from the elbow down into the fifth digit, otherwise no complaints of bilateral upper extremity.  Went to [facility] neurology in the past, started on Neurontin, no benefit. Thinks they may have done a MRI but she is unsure. Unable to get the records.  Review of Systems: Patient complains of symptoms per HPI as well as the following symptoms: Chills, fatigue, cold intolerance, nausea, insomnia, daytime sleepiness, sleep talking, joint pain, joint swelling, back muscles, muscle cramps, walking difficulty, neck pain and stiffness, memory loss, dizziness, headache, numbness, weakness, agitation and decreased concentration, depression, nervousness and anxiety. Pertinent negatives per HPI. All  others negative.   Social History   Social History  . Marital status: Divorced    Spouse name: N/A  . Number of children: 2  . Years of education: 8th   Occupational History  . Not on file.   Social History Main Topics  . Smoking status: Never Smoker  . Smokeless tobacco: Never Used  . Alcohol use No  . Drug use: No  . Sexual activity: Not on file   Other Topics Concern  . Not on file   Social History Narrative   Patient lives at home with her kids and her mom.    Patient does not work.    Patient has a 8th grade education.    Patient has 2 children.     Family History  Problem Relation Age of Onset  . Bipolar disorder    . Headache    . Depression    . Panic disorder    . Cancer      Past Medical History:  Diagnosis Date  . Anxiety   . Bipolar 1 disorder (HCC)   . Depression     Past Surgical History:  Procedure Laterality Date  . TUBAL LIGATION  2004    Current Outpatient Prescriptions  Medication Sig Dispense Refill  . citalopram (CELEXA) 20 MG tablet Take 20 mg by mouth daily.    . clonazePAM (KLONOPIN) 1 MG tablet Take 1 mg by mouth daily.    Marland Kitchen. gabapentin (NEURONTIN) 300 MG capsule Take 300 mg by mouth 4 (four) times daily.    Marland Kitchen. lisdexamfetamine (VYVANSE) 50 MG capsule Take 50 mg by mouth daily.    Marland Kitchen. LORazepam (ATIVAN) 0.5 MG tablet Take 0.5 mg by mouth 2 (two) times daily.    . Oxcarbazepine (TRILEPTAL) 300 MG tablet TAKE 1 TABLET (300 MG TOTAL) BY MOUTH TWO TIMES DAILY (SCHEDULE APPT WITH NEW MD) 30 tablet 0  . zaleplon (SONATA) 10 MG capsule Take 10 mg by mouth at bedtime as needed for sleep.    Marland Kitchen. amitriptyline (ELAVIL) 25 MG tablet Take 1 tablet (25 mg total) by mouth at bedtime. 30 tablet 12   No current facility-administered medications for this visit.     Allergies as of 06/24/2016  . (No Known Allergies)    Vitals: BP (!) 160/104 (BP Location: Right Arm, Patient Position: Sitting, Cuff Size: Large)   Pulse 66   Ht 5\' 4"  (1.626 m)    Wt 264 lb 9.6 oz (120 kg)   BMI 45.42 kg/m  Last Weight:  Wt Readings from Last 1 Encounters:  06/24/16 264 lb 9.6 oz (120 kg)   Last Height:   Ht Readings from Last 1 Encounters:  06/24/16 5\' 4"  (1.626 m)    Physical exam: Exam: Gen: NAD, conversant, well nourised, obese, well groomed                     Eyes: Conjunctivae clear without exudates or hemorrhage  Neuro: Detailed Neurologic Exam  Speech:    Speech is normal; fluent and spontaneous with normal comprehension.  Cognition:    The patient is oriented to person, place, and time;  recent and remote memory intact;     language fluent;     normal attention, concentration,     fund of knowledge Cranial Nerves:    The pupils are equal, round, and reactive to light. Papilledema? The fundi are normal and spontaneous venous pulsations are present. Visual fields are full to finger confrontation. Extraocular movements are intact. Trigeminal sensation is intact and the muscles of mastication are normal. The face is symmetric. The palate elevates in the midline. Hearing intact. Voice is normal. Shoulder shrug is normal. The tongue has normal motion without fasciculations.   Coordination:    Normal finger to nose and heel to shin.   Motor Observation:    No asymmetry, no atrophy, and no involuntary movements noted. Tone:    Normal muscle tone.    Posture:    Posture is normal. normal erect    Strength:    Strength is V/V in the upper and lower limbs.      Assessment:  After physical and neurologic examination, review of laboratory studies, imaging, neurophysiology testing and pre-existing records, assessment will be reviewed on the problem list.  Plan:  Treatment plan and additional workup will be reviewed under Problem List.  1)Headaches 2)Lumbar back pain 3)Remote CVA  Ms Ercole is a pleasant 35 year old worsening of her follow up evaluation of headaches, chronic lower back pain, vision changes, chronic pain  and fibromyalgia, remote Her main concern is continued chronic lumbar back pain which has been refractory to pain medication.  Will refer to pain clinic. MRI shows questionable small right thalamic infarct which was asymptomatic. Will check Hemoglobin A1c and Lipid panel. Will have patient start ASA 81mg  daily.   - Daily baby aspirin - check cholesterol - check HgbA1c - Not on birth control pills, does not smoke. - MRi of the brain and orbits for worsening headaches, vision changes, possible papilledema - pain clinic for low back pain and evaluation for epidural steroid injections - start amitriptyline patient's migraines, headaches, chronic pain may also help with her insomnia. Discussed side effects as per patient instructions. Do not get pregnant on this medication.  Naomie Dean, MD  [facility] [address] [place], [place] [address]  Phone [phone] Fax [phone]  A total of 40 minutes was spent face-to-face with this patient. Over half this time was spent on counseling patient on the chronic low back pain, headache and papilledema, lacunar infarct, fibromyalgia diagnosis and different diagnostic and therapeutic options available.

## 2016-06-25 ENCOUNTER — Telehealth: Payer: Self-pay | Admitting: *Deleted

## 2016-06-25 LAB — HEMOGLOBIN A1C
ESTIMATED AVERAGE GLUCOSE: 100 mg/dL
HEMOGLOBIN A1C: 5.1 % (ref 4.8–5.6)

## 2016-06-25 LAB — COMPREHENSIVE METABOLIC PANEL
ALBUMIN: 4.2 g/dL (ref 3.5–5.5)
ALT: 13 IU/L (ref 0–32)
AST: 14 IU/L (ref 0–40)
Albumin/Globulin Ratio: 1.6 (ref 1.2–2.2)
Alkaline Phosphatase: 69 IU/L (ref 39–117)
BUN/Creatinine Ratio: 11 (ref 9–23)
BUN: 11 mg/dL (ref 6–20)
Bilirubin Total: 0.2 mg/dL (ref 0.0–1.2)
CALCIUM: 9.3 mg/dL (ref 8.7–10.2)
CO2: 25 mmol/L (ref 18–29)
Chloride: 101 mmol/L (ref 96–106)
Creatinine, Ser: 0.99 mg/dL (ref 0.57–1.00)
GFR calc Af Amer: 86 mL/min/{1.73_m2} (ref >59)
GFR calc non Af Amer: 75 mL/min/{1.73_m2} (ref >59)
GLOBULIN, TOTAL: 2.7 g/dL (ref 1.5–4.5)
Glucose: 106 mg/dL — ABNORMAL HIGH (ref 65–99)
POTASSIUM: 5.5 mmol/L — AB (ref 3.5–5.2)
Sodium: 139 mmol/L (ref 134–144)
TOTAL PROTEIN: 6.9 g/dL (ref 6.0–8.5)

## 2016-06-25 LAB — LIPID PANEL
CHOLESTEROL TOTAL: 159 mg/dL (ref 100–199)
Chol/HDL Ratio: 3.2 ratio units (ref 0.0–4.4)
HDL: 49 mg/dL (ref >39)
LDL Calculated: 93 mg/dL (ref 0–99)
Triglycerides: 86 mg/dL (ref 0–149)
VLDL Cholesterol Cal: 17 mg/dL (ref 5–40)

## 2016-06-25 LAB — CBC
Hematocrit: 37.6 % (ref 34.0–46.6)
Hemoglobin: 12.4 g/dL (ref 11.1–15.9)
MCH: 28.1 pg (ref 26.6–33.0)
MCHC: 33 g/dL (ref 31.5–35.7)
MCV: 85 fL (ref 79–97)
Platelets: 257 10*3/uL (ref 150–379)
RBC: 4.41 x10E6/uL (ref 3.77–5.28)
RDW: 14.4 % (ref 12.3–15.4)
WBC: 5 10*3/uL (ref 3.4–10.8)

## 2016-06-25 NOTE — Telephone Encounter (Signed)
-----   Message from Anson FretAntonia B Ahern, MD sent at 06/25/2016  8:54 AM EDT ----- Labs are unremarkable. Her bad cholesterol is 93 which isn't bad, technically normal. Pending the MRI of the brain results we can see if should start a statin. Will call her after she has MRi of the brain thanks.

## 2016-06-25 NOTE — Telephone Encounter (Signed)
Called and spoke to patient about lab results per Dr Lucia GaskinsAhern note. She verbalized understanding. She asked how long it usually takes to be contacted to schedule MRI. Advised typically should hear about scheduling within a week or week and a half. If she does not hear about scheduling within that time frame, she can call me.

## 2016-07-09 ENCOUNTER — Ambulatory Visit
Admission: RE | Admit: 2016-07-09 | Discharge: 2016-07-09 | Disposition: A | Discharge status: Home / Self Care | Payer: Medicaid Other | Source: Ambulatory Visit | Attending: Neurology | Admitting: Neurology

## 2016-07-09 DIAGNOSIS — H539 Unspecified visual disturbance: Secondary | ICD-10-CM | POA: Diagnosis not present

## 2016-07-09 DIAGNOSIS — R51 Headache with orthostatic component, not elsewhere classified: Secondary | ICD-10-CM

## 2016-07-09 DIAGNOSIS — H05119 Granuloma of unspecified orbit: Secondary | ICD-10-CM

## 2016-07-09 DIAGNOSIS — H4710 Unspecified papilledema: Secondary | ICD-10-CM | POA: Diagnosis not present

## 2016-07-09 DIAGNOSIS — H9190 Unspecified hearing loss, unspecified ear: Secondary | ICD-10-CM

## 2016-07-09 DIAGNOSIS — I6381 Other cerebral infarction due to occlusion or stenosis of small artery: Secondary | ICD-10-CM

## 2016-07-09 MED ORDER — GADOBENATE DIMEGLUMINE 529 MG/ML IV SOLN
20.0000 mL | Freq: Once | INTRAVENOUS | Status: AC | PRN
  Administered 2016-07-09: 20 mL via INTRAVENOUS

## 2016-07-12 ENCOUNTER — Telehealth: Payer: Self-pay | Admitting: *Deleted

## 2016-07-12 NOTE — Telephone Encounter (Signed)
Called and relayed results per Dr Lucia GaskinsAhern. Pt verbalized understanding. She states she will continue daily baby aspirin per Dr Lucia GaskinsAhern recommendation.

## 2016-07-12 NOTE — Telephone Encounter (Signed)
-----   Message from Anson FretAntonia B Ahern, MD sent at 07/11/2016  7:22 PM EDT ----- MRI of the brain is unremarkable. No changes since 2015. I advise her to continue to take daily baby aspirin but no definitive evidence for stroke in her brain thanks

## 2016-10-25 ENCOUNTER — Ambulatory Visit: Payer: Medicaid Other | Admitting: Neurology

## 2016-10-25 ENCOUNTER — Telehealth: Payer: Self-pay | Admitting: *Deleted

## 2016-10-25 NOTE — Telephone Encounter (Signed)
no showed f/u 

## 2016-10-26 ENCOUNTER — Encounter: Payer: Self-pay | Admitting: Neurology

## 2017-06-06 ENCOUNTER — Telehealth: Payer: Self-pay | Admitting: Neurology

## 2017-06-06 NOTE — Telephone Encounter (Signed)
Spoke to pt and was checking if she had seen PM ( Dr. Ethelene Hal) she stated she never got a call.  Will forward message to [facility] in referrals, and see if they can see her now.

## 2017-06-06 NOTE — Telephone Encounter (Signed)
Patient has been scheduled to see Butch Penny 06/14/17 @ 9:30am.  Follow up for left increasing left leg pain.  Dr. Lucia Gaskins patient. FYI.

## 2017-06-07 ENCOUNTER — Telehealth: Payer: Self-pay | Admitting: Neurology

## 2017-06-07 NOTE — Telephone Encounter (Signed)
[facility] [place] [facility] and relayed patient has CX two apt's and patient will need to call and schedule. Called patient and relayed to patient and she understood details.  Sent referral to Dr. Ethelene [phone] fax [phone] .

## 2017-06-14 ENCOUNTER — Encounter: Payer: Medicaid Other | Admitting: Adult Health

## 2017-06-14 ENCOUNTER — Telehealth: Payer: Self-pay | Admitting: Adult Health

## 2017-06-14 NOTE — Telephone Encounter (Signed)
[facility] Patient can't be scheduled until she calls . Patient has had two CX and One No-show.

## 2017-06-14 NOTE — Progress Notes (Signed)
This encounter was created in error - please disregard.

## 2017-06-14 NOTE — Telephone Encounter (Signed)
Pt was attempting to call when in the office.

## 2017-10-30 ENCOUNTER — Emergency Department (HOSPITAL_BASED_OUTPATIENT_CLINIC_OR_DEPARTMENT_OTHER): Payer: Medicaid Other

## 2017-10-30 ENCOUNTER — Other Ambulatory Visit: Payer: Self-pay

## 2017-10-30 ENCOUNTER — Emergency Department (HOSPITAL_BASED_OUTPATIENT_CLINIC_OR_DEPARTMENT_OTHER)
Admission: EM | Admit: 2017-10-30 | Discharge: 2017-10-30 | Disposition: A | Discharge status: Home / Self Care | Payer: Medicaid Other | Attending: Emergency Medicine | Admitting: Emergency Medicine

## 2017-10-30 DIAGNOSIS — Z79899 Other long term (current) drug therapy: Secondary | ICD-10-CM | POA: Diagnosis not present

## 2017-10-30 DIAGNOSIS — S93401A Sprain of unspecified ligament of right ankle, initial encounter: Secondary | ICD-10-CM | POA: Diagnosis not present

## 2017-10-30 DIAGNOSIS — Y9301 Activity, walking, marching and hiking: Secondary | ICD-10-CM | POA: Diagnosis not present

## 2017-10-30 DIAGNOSIS — Y998 Other external cause status: Secondary | ICD-10-CM | POA: Insufficient documentation

## 2017-10-30 DIAGNOSIS — X509XXA Other and unspecified overexertion or strenuous movements or postures, initial encounter: Secondary | ICD-10-CM | POA: Insufficient documentation

## 2017-10-30 DIAGNOSIS — S99911A Unspecified injury of right ankle, initial encounter: Secondary | ICD-10-CM | POA: Diagnosis present

## 2017-10-30 DIAGNOSIS — Y929 Unspecified place or not applicable: Secondary | ICD-10-CM | POA: Diagnosis not present

## 2017-10-30 NOTE — ED Triage Notes (Signed)
Patient states that she twisted her right ankle last week and she continues to have pain to her right ankle and swelling  - patient reports that she was here with her mom so she thought she would get checked out as well

## 2017-10-30 NOTE — ED Provider Notes (Signed)
[facility] EMERGENCY DEPARTMENT Provider Note   CSN: [id number] Arrival date & time: 10/30/17  1019     History   Chief Complaint Chief Complaint  Patient presents with  . Ankle Pain    HPI Nancy Snow is a 37 y.o. female.  Patient presents with continued ankle pain after a twisting injury 1 week ago.  In the interim, she slipped again and reaggravated it.  She has swelling and pain over the lateral aspect of her right ankle.  She states that she has been using ibuprofen without relief.  She has been dragging her foot when she walks. No knee or hip pain. The onset of this condition was acute. The course is constant. Aggravating factors: walking. Alleviating factors: none.        Past Medical History:  Diagnosis Date  . Anxiety   . Bipolar 1 disorder (HCC)   . Depression     Patient Active Problem List   Diagnosis Date Noted  . Chronic low back pain 06/24/2016  . Migraine 06/24/2016  . Paresthesias 07/23/2013      OB History    No data available       Home Medications    Prior to Admission medications   Medication Sig Start Date End Date Taking? Authorizing Provider  amitriptyline (ELAVIL) 25 MG tablet Take 1 tablet (25 mg total) by mouth at bedtime. Patient taking differently: Take 50 mg by mouth at bedtime.  06/24/16   Anson FretAhern, Antonia B, MD  buPROPion (WELLBUTRIN XL) 150 MG 24 hr tablet Take 150 mg by mouth daily.    [provider]  Eszopiclone (ESZOPICLONE) 3 MG TABS Take 3 mg by mouth at bedtime as needed. Take immediately before bedtime    [provider]  gabapentin (NEURONTIN) 300 MG capsule Take 600 mg by mouth 4 (four) times daily.     [provider]  lisdexamfetamine (VYVANSE) 50 MG capsule Take 50 mg by mouth daily.    [provider]  LORazepam (ATIVAN) 0.5 MG tablet Take 1 mg by mouth 3 (three) times daily.     [provider]  Oxcarbazepine (TRILEPTAL) 300 MG tablet TAKE 1 TABLET (300 MG  TOTAL) BY MOUTH TWO TIMES DAILY (SCHEDULE APPT WITH NEW MD) 11/15/14   York SpanielWillis, Charles K, MD  zolpidem (AMBIEN) 10 MG tablet Take 10 mg by mouth at bedtime as needed for sleep.    [provider]    Family History Family History  Problem Relation Age of Onset  . Bipolar disorder Unknown   . Headache Unknown   . Depression Unknown   . Panic disorder Unknown   . Cancer Unknown     Social History Social History   Tobacco Use  . Smoking status: Never Smoker  . Smokeless tobacco: Never Used  Substance Use Topics  . Alcohol use: No  . Drug use: No     Allergies   Patient has no known allergies.   Review of Systems Review of Systems  Constitutional: Negative for activity change.  Musculoskeletal: Positive for arthralgias and joint swelling. Negative for back pain and neck pain.  Skin: Negative for wound.  Neurological: Negative for weakness and numbness.     Physical Exam Updated Vital Signs BP (!) 170/63 (BP Location: Left Arm)   Pulse 86   Temp 98.5 F (36.9 C) (Oral)   Resp 18   Ht 5\' 4"  (1.626 m)   Wt 122.9 kg (271 lb)   SpO2 100%  BMI 46.52 kg/m   Physical Exam  Constitutional: She appears well-developed and well-nourished.  HENT:  Head: Normocephalic and atraumatic.  Eyes: Pupils are equal, round, and reactive to light.  Neck: Normal range of motion. Neck supple.  Cardiovascular: Normal pulses. Exam reveals no decreased pulses.  Pulses:      Dorsalis pedis pulses are 2+ on the right side, and 2+ on the left side.  Musculoskeletal: She exhibits tenderness. She exhibits no edema.       Right hip: Normal.       Right knee: Normal. No tenderness found. No medial joint line and no lateral joint line tenderness noted.       Right ankle: She exhibits decreased range of motion, swelling and ecchymosis. Tenderness. Lateral malleolus tenderness found.       Right foot: There is normal range of motion, no tenderness and no bony tenderness.  Neurological:  She is alert. No sensory deficit.  Motor, sensation, and vascular distal to the injury is fully intact.   Skin: Skin is warm and dry.  Psychiatric: She has a normal mood and affect.  Nursing note and vitals reviewed.    ED Treatments / Results  Labs (all labs ordered are listed, but only abnormal results are displayed) Labs Reviewed - No data to display  EKG  EKG Interpretation None       Radiology Dg Ankle Complete Right  Result Date: 10/30/2017 CLINICAL DATA:  Right ankle pain and swelling following a twisting injury last week. EXAM: RIGHT ANKLE - COMPLETE 3+ VIEW COMPARISON:  None. FINDINGS: Diffuse soft tissue swelling, most pronounced laterally and anteriorly. No fracture, dislocation or effusion seen. IMPRESSION: No fracture. Electronically Signed   By: Beckie Salts M.D.   On: 10/30/2017 11:09    Procedures Procedures (including critical care time)  Medications Ordered in ED Medications - No data to display   Initial Impression / Assessment and Plan / ED Course  I have reviewed the triage vital signs and the nursing notes.  Pertinent labs & imaging results that were available during my care of the patient were reviewed by me and considered in my medical decision making (see chart for details).     Patient seen and examined. X-ray neg.   Vital signs reviewed and are as follows: BP (!) 170/63 (BP Location: Left Arm)   Pulse 86   Temp 98.5 F (36.9 C) (Oral)   Resp 18   Ht 5\' 4"  (1.626 m)   Wt 122.9 kg (271 lb)   SpO2 100%   BMI 46.52 kg/m   Plan: ACE wrap (pt declines crutches), ortho f/u, RICE/NSAIDs. Discussed ROM exercises to increase mobility.   Final Clinical Impressions(s) / ED Diagnoses   Final diagnoses:  Sprain of right ankle, unspecified ligament, initial encounter   Patient with ankle sprain, negative imaging.  Lower extremity neurovascularly intact.  Plan as above.  ED Discharge Orders    None       Renne Crigler, Cordelia Poche 10/30/17  1125    Charlynne Pander, MD 10/30/17 1440

## 2017-10-30 NOTE — Discharge Instructions (Signed)
Please read and follow all provided instructions.  Your diagnoses today include:  1. Sprain of right ankle, unspecified ligament, initial encounter     Tests performed today include:  An x-ray of your ankle - does NOT show any broken bones  Vital signs. See below for your results today.   Medications prescribed:   None  Take any prescribed medications only as directed.  Home care instructions:   Follow any educational materials contained in this packet  Follow R.I.C.E. Protocol:  R - rest your injury   I  - use ice on injury without applying directly to skin  C - compress injury with bandage or splint  E - elevate the injury as much as possible  Follow-up instructions: Please follow-up with the provided orthopedic (bone specialist).   Return instructions:   Please return if your toes are numb or tingling, appear gray or blue, or you have severe pain (also elevate leg and loosen splint or wrap)  Please return to the Emergency Department if you experience worsening symptoms.   Please return if you have any other emergent concerns.  Additional Information:  Your vital signs today were: BP (!) 170/63 (BP Location: Left Arm)    Pulse 86    Temp 98.5 F (36.9 C) (Oral)    Resp 18    Ht 5\' 4"  (1.626 m)    Wt 122.9 kg (271 lb)    SpO2 100%    BMI 46.52 kg/m  If your blood pressure (BP) was elevated above 135/85 this visit, please have this repeated by your doctor within one month. --------------

## 2017-12-29 NOTE — Telephone Encounter (Signed)
Error

## 2018-09-10 IMAGING — DX DG ANKLE COMPLETE 3+V*R*
3 series · 3 of 3 positions shown · non-contrast
Comparison: None.

CLINICAL DATA: Right ankle pain and swelling following a twisting
injury last week.

EXAM:
RIGHT ANKLE - COMPLETE 3+ VIEW

[ankle ap]
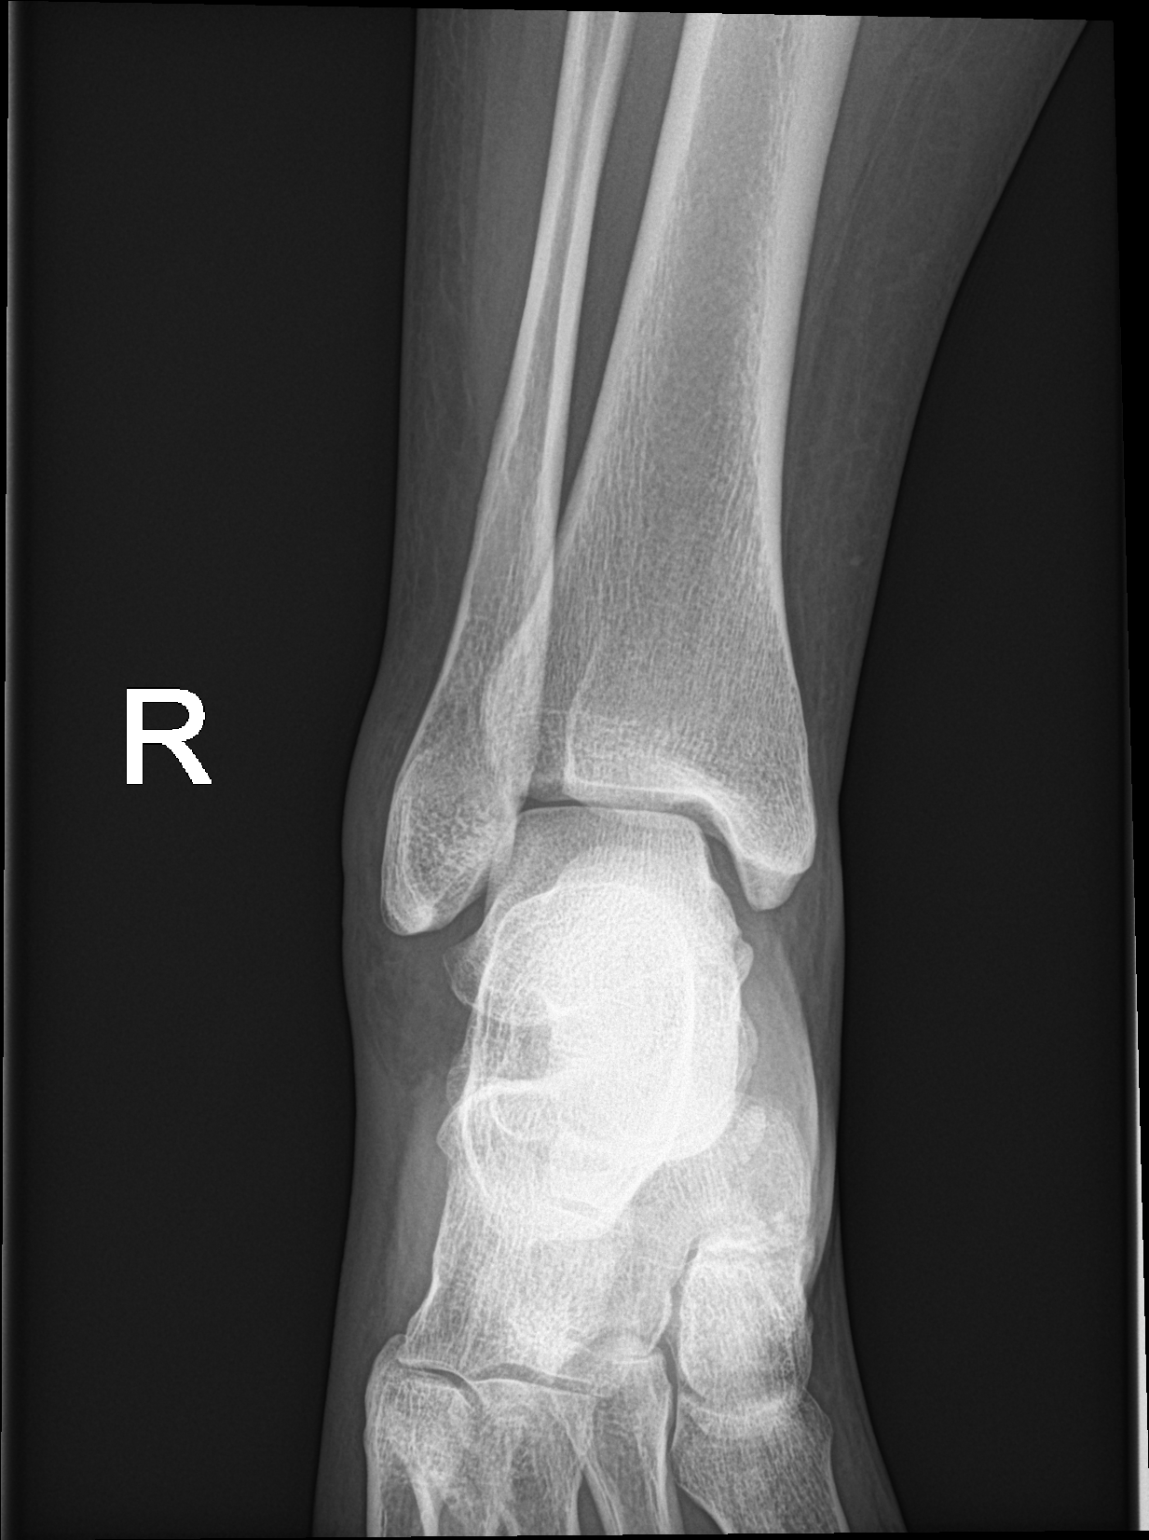

[ankle obl]
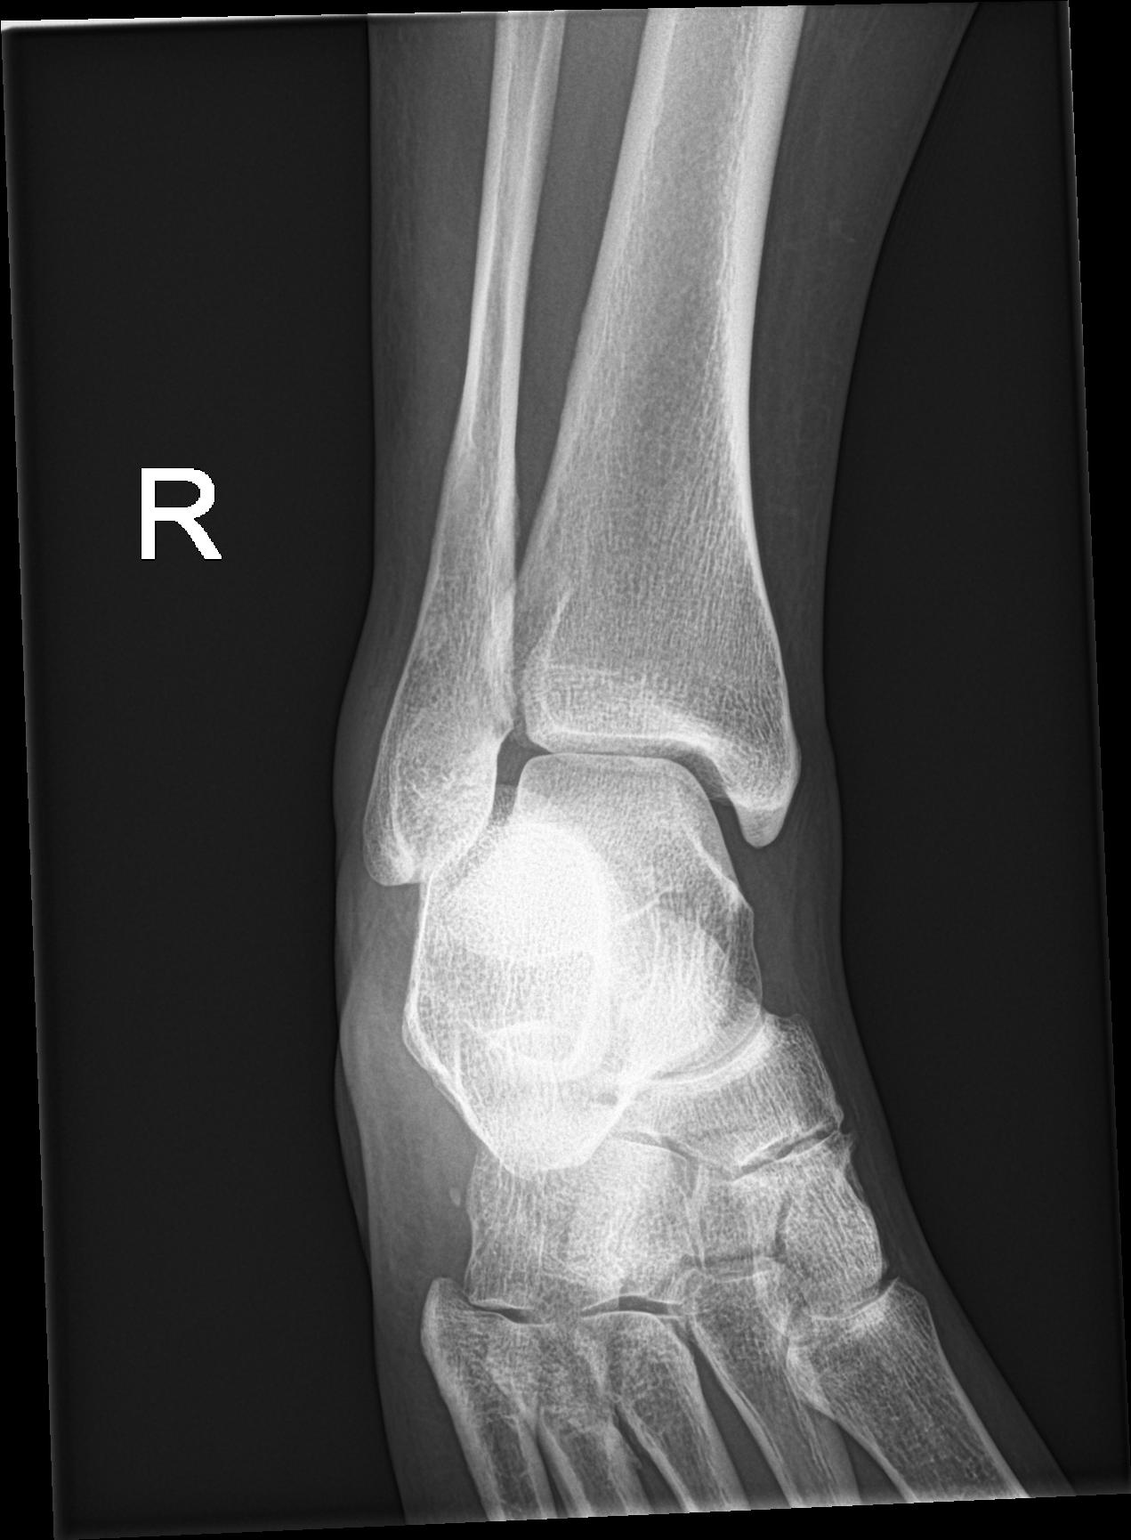

[ankle lat]
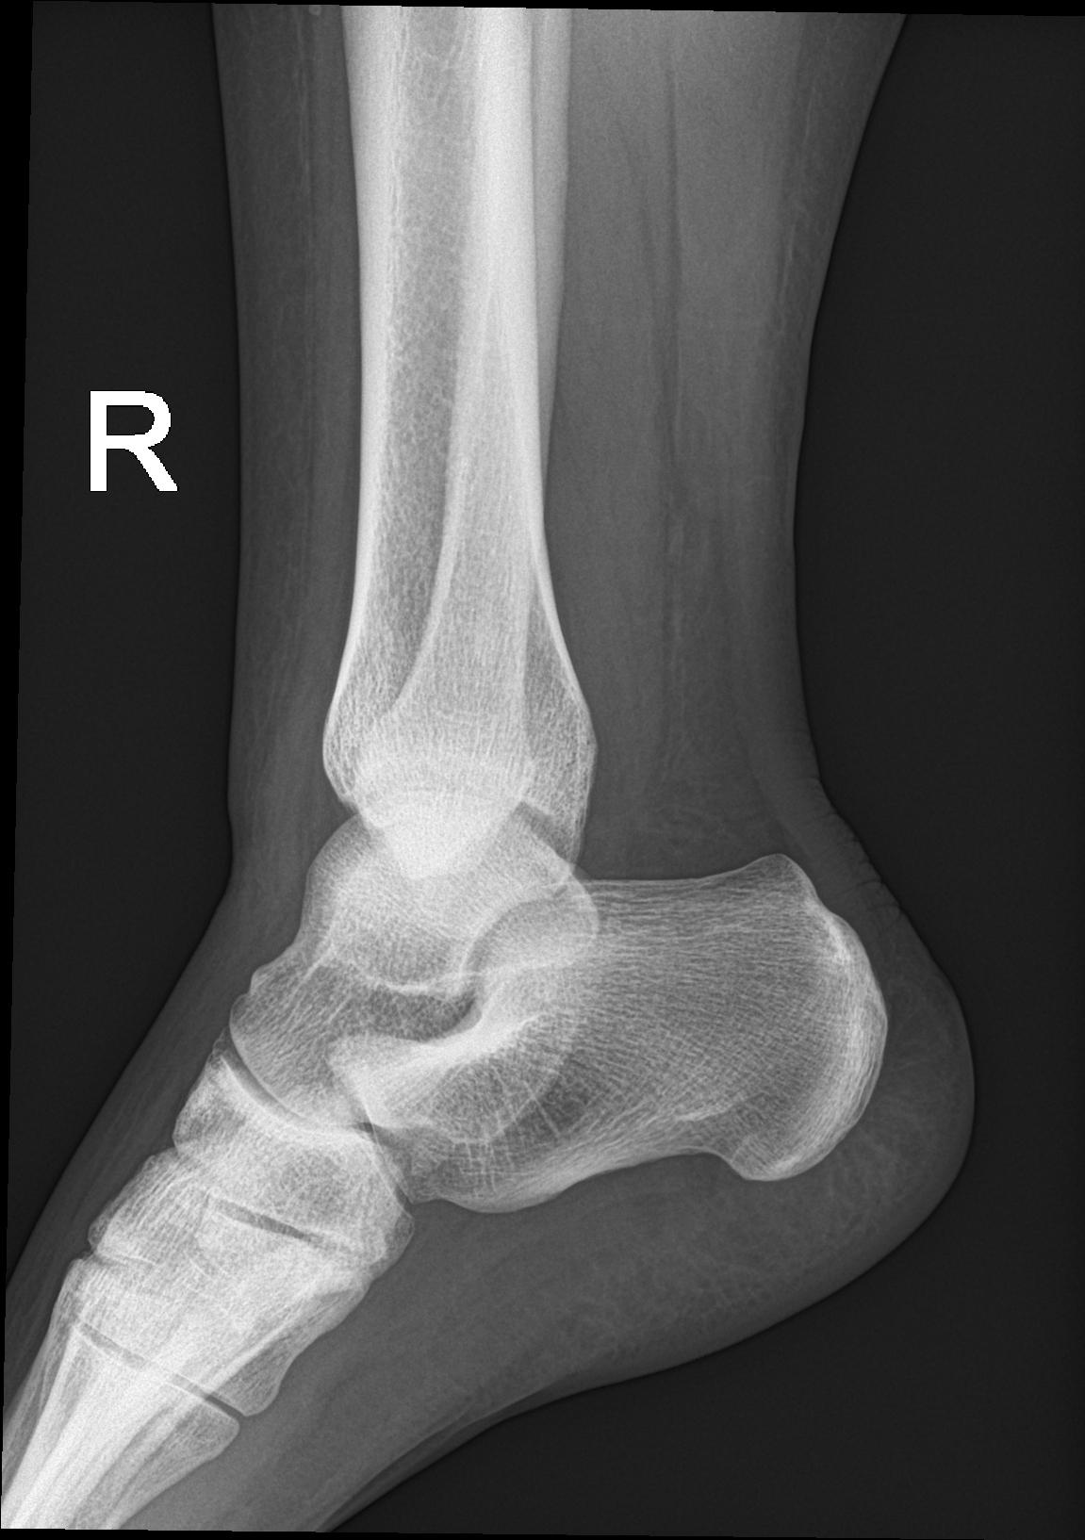

[3 of 3 positions shown; findings below may reference images not displayed]

FINDINGS: Diffuse soft tissue swelling, most pronounced laterally and
anteriorly. No fracture, dislocation or effusion seen.
IMPRESSION: No fracture.
